# Patient Record
Sex: Female | Born: 1960 | Race: White | Hispanic: No
Health system: Southern US, Community
[De-identification: ages and names within clinical notes are randomized; demographics above are authoritative.]

---

## 2003-11-13 ENCOUNTER — Emergency Department: Payer: Self-pay | Admitting: Emergency Medicine

## 2004-04-04 ENCOUNTER — Emergency Department: Payer: Self-pay | Admitting: General Practice

## 2004-07-01 ENCOUNTER — Emergency Department: Payer: Self-pay | Admitting: General Practice

## 2004-11-18 ENCOUNTER — Emergency Department: Payer: Self-pay | Admitting: Unknown Physician Specialty

## 2005-02-27 ENCOUNTER — Emergency Department: Payer: Self-pay | Admitting: Emergency Medicine

## 2006-02-05 ENCOUNTER — Other Ambulatory Visit: Payer: Self-pay

## 2006-02-05 ENCOUNTER — Inpatient Hospital Stay: Payer: Self-pay | Admitting: Internal Medicine

## 2006-02-06 ENCOUNTER — Other Ambulatory Visit: Payer: Self-pay

## 2006-03-25 ENCOUNTER — Emergency Department: Payer: Self-pay | Admitting: Emergency Medicine

## 2006-08-18 ENCOUNTER — Other Ambulatory Visit: Payer: Self-pay

## 2006-08-19 ENCOUNTER — Inpatient Hospital Stay: Payer: Self-pay | Admitting: Internal Medicine

## 2006-09-06 ENCOUNTER — Other Ambulatory Visit: Payer: Self-pay

## 2006-09-06 ENCOUNTER — Inpatient Hospital Stay: Payer: Self-pay | Admitting: Internal Medicine

## 2007-02-10 IMAGING — CR RIGHT ANKLE - COMPLETE 3+ VIEW
1 series · 5 of 5 positions shown · non-contrast
Comparison: none

REASON FOR EXAM: Fall
COMMENTS:  LMP: Post Hysterectomy

PROCEDURE:     DXR - DXR ANKLE RIGHT COMPLETE  - November 18, 2004  [DATE]
RESULT:        Five views of the RIGHT ankle show no fracture, dislocation
or other acute bony abnormality.

[Series 2109: antero_posterior · 0.11mm/px · 5 of 5 slices shown]
[im 1/5]
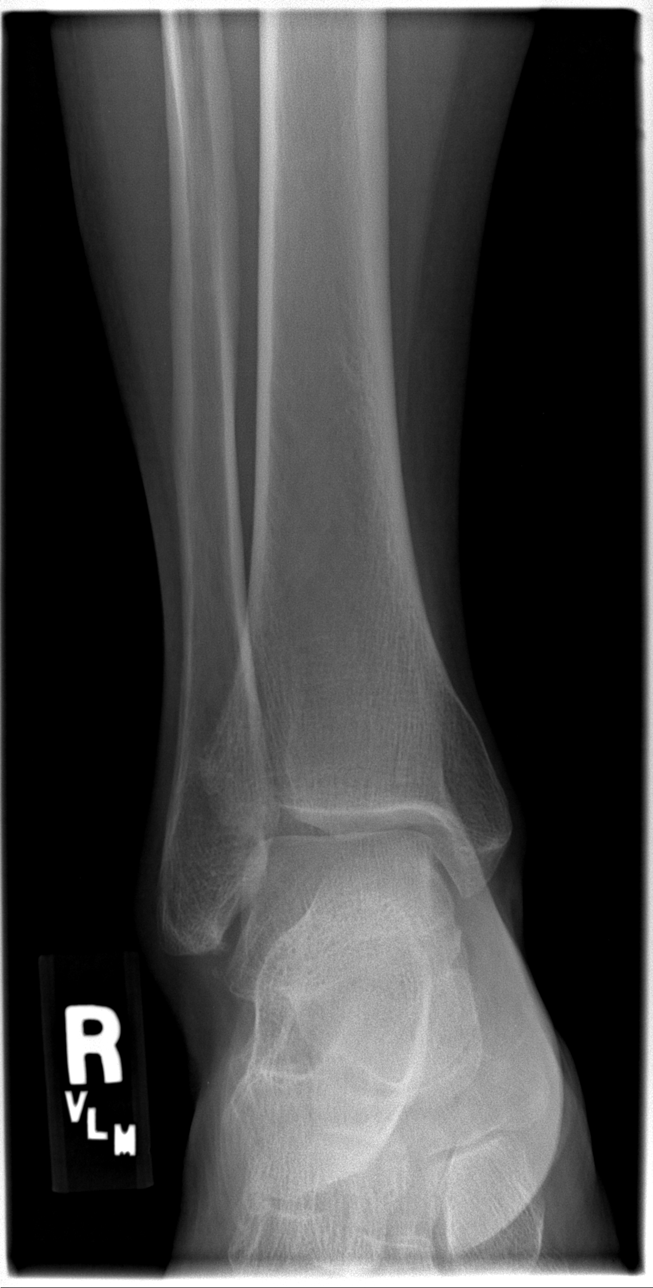
[im 2/5]
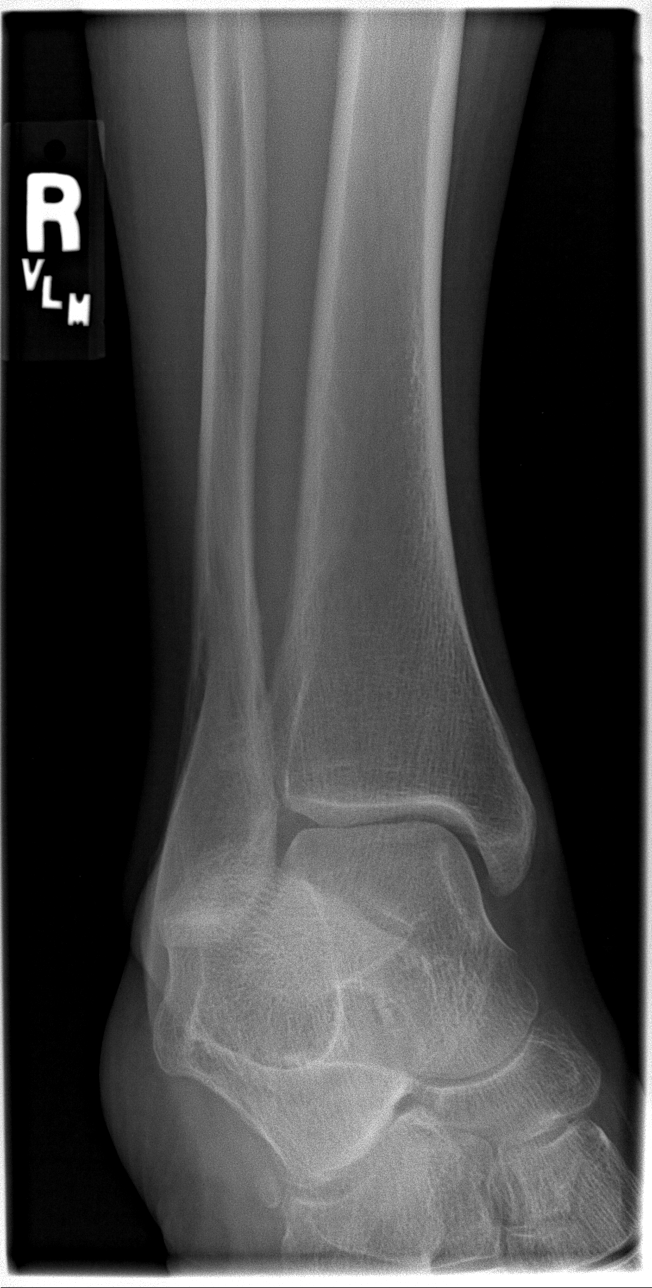
[im 3/5]
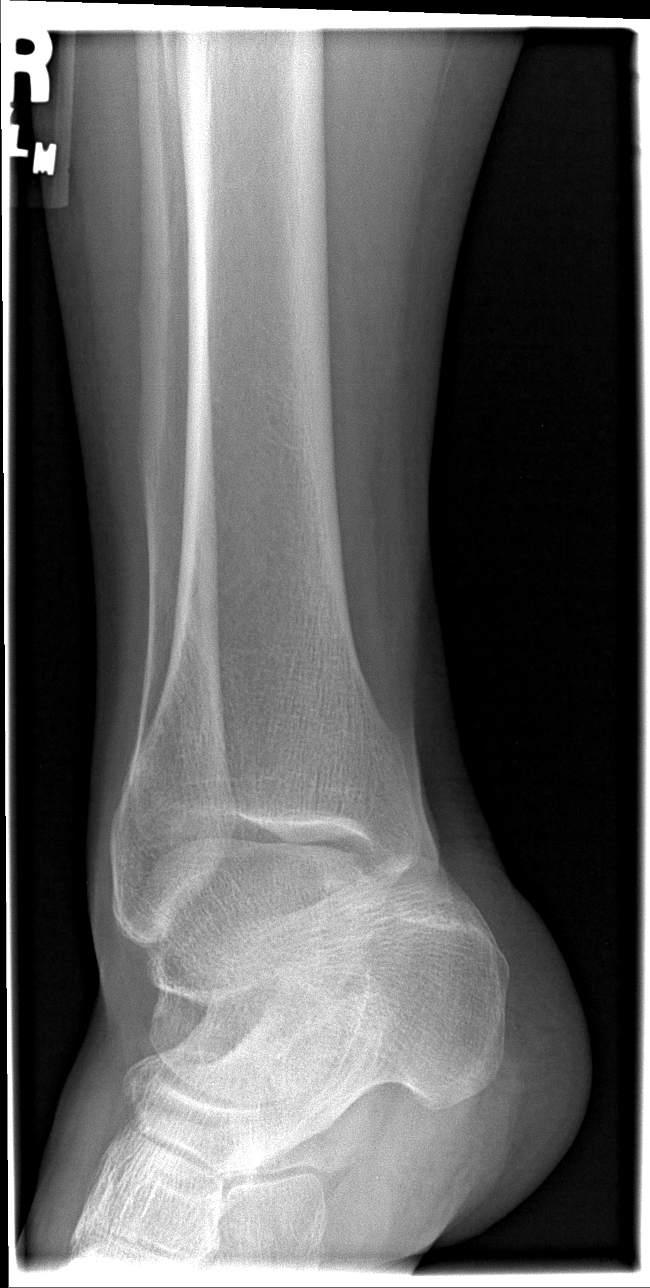
[im 4/5]
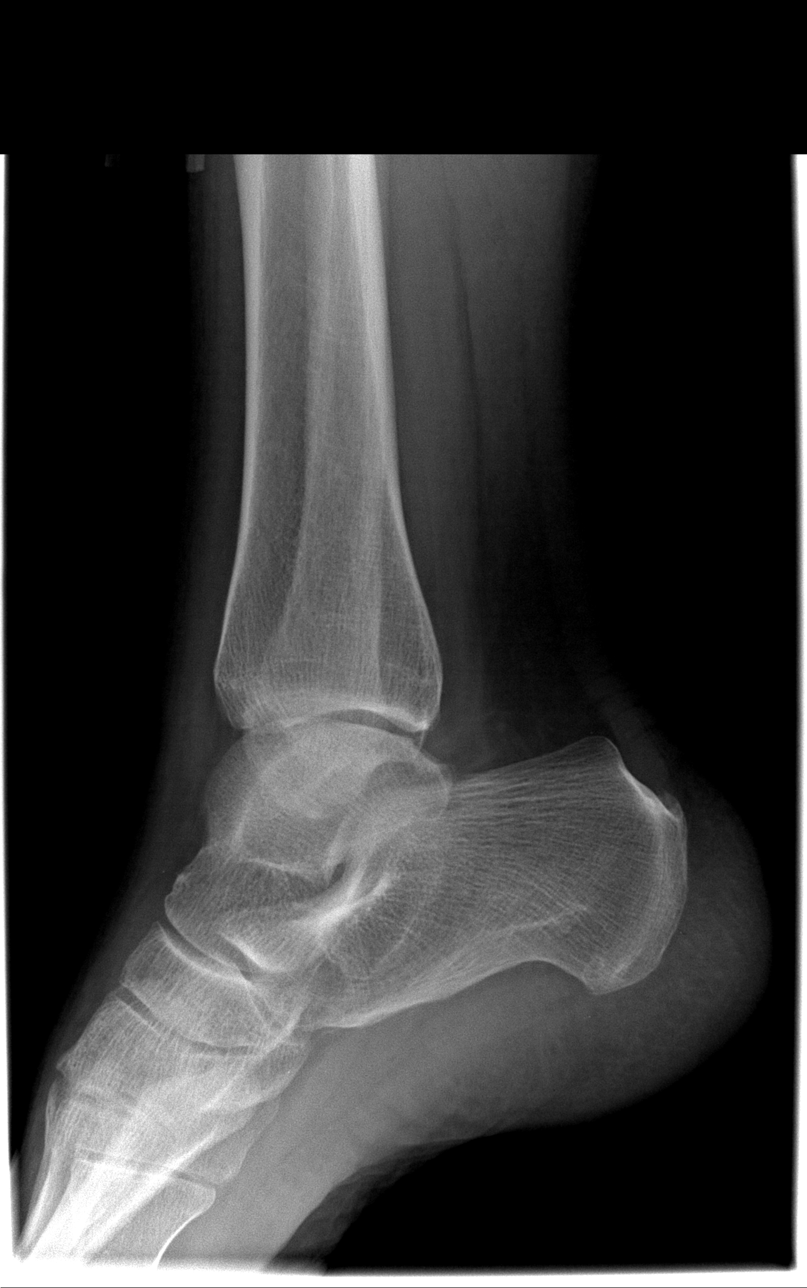
[im 5/5]
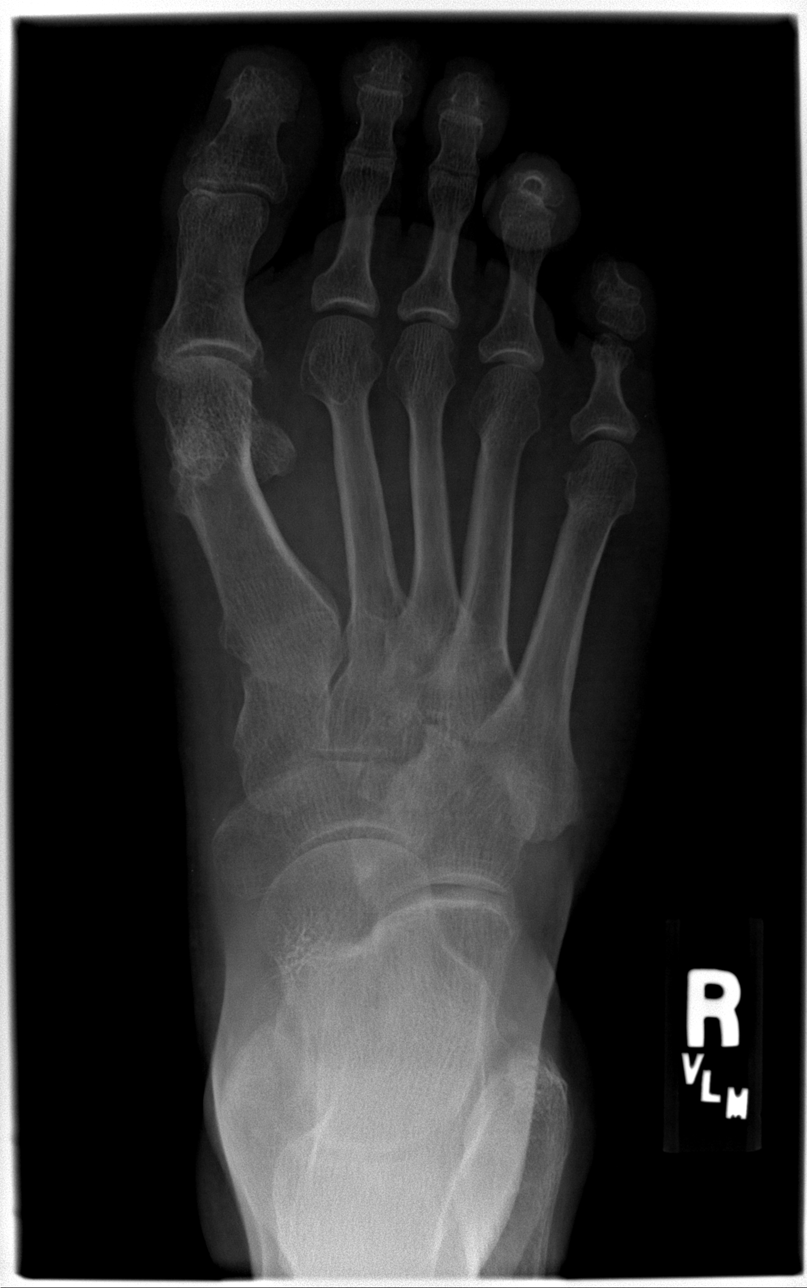

[5 of 5 positions shown; findings below may reference images not displayed]

IMPRESSION: No acute changes are identified.

## 2007-07-08 ENCOUNTER — Emergency Department: Payer: Self-pay | Admitting: Emergency Medicine

## 2008-09-10 ENCOUNTER — Emergency Department: Payer: Self-pay | Admitting: Emergency Medicine

## 2008-10-05 ENCOUNTER — Emergency Department: Payer: Self-pay | Admitting: Emergency Medicine

## 2008-10-10 ENCOUNTER — Emergency Department: Payer: Self-pay | Admitting: Emergency Medicine

## 2008-11-30 ENCOUNTER — Emergency Department: Payer: Self-pay | Admitting: Emergency Medicine

## 2008-12-05 ENCOUNTER — Emergency Department: Payer: Self-pay | Admitting: Emergency Medicine

## 2008-12-10 ENCOUNTER — Emergency Department: Payer: Self-pay | Admitting: Unknown Physician Specialty

## 2009-01-07 ENCOUNTER — Emergency Department: Payer: Self-pay | Admitting: Internal Medicine

## 2011-08-25 ENCOUNTER — Inpatient Hospital Stay: Payer: Self-pay | Admitting: Internal Medicine

## 2011-08-25 LAB — CBC
HGB: 12.5 g/dL (ref 12.0–16.0)
MCH: 32.8 pg (ref 26.0–34.0)
Platelet: 232 10*3/uL (ref 150–440)
RBC: 3.8 10*6/uL (ref 3.80–5.20)
WBC: 12.8 10*3/uL — ABNORMAL HIGH (ref 3.6–11.0)

## 2011-08-25 LAB — BASIC METABOLIC PANEL
Anion Gap: 4 — ABNORMAL LOW (ref 7–16)
BUN: 11 mg/dL (ref 7–18)
Calcium, Total: 8.7 mg/dL (ref 8.5–10.1)
Co2: 28 mmol/L (ref 21–32)
Creatinine: 0.97 mg/dL (ref 0.60–1.30)
Osmolality: 288 (ref 275–301)
Potassium: 4.7 mmol/L (ref 3.5–5.1)
Sodium: 146 mmol/L — ABNORMAL HIGH (ref 136–145)

## 2011-08-25 LAB — CK TOTAL AND CKMB (NOT AT ARMC)
CK, Total: 153 U/L (ref 21–215)
CK-MB: 1.8 ng/mL (ref 0.5–3.6)

## 2011-08-25 LAB — PRO B NATRIURETIC PEPTIDE: B-Type Natriuretic Peptide: 834 pg/mL — ABNORMAL HIGH (ref 0–125)

## 2011-08-25 LAB — TROPONIN I: Troponin-I: <0.02 ng/mL

## 2011-08-26 LAB — BASIC METABOLIC PANEL
BUN: 5 mg/dL — ABNORMAL LOW (ref 7–18)
Chloride: 114 mmol/L — ABNORMAL HIGH (ref 98–107)
Co2: 21 mmol/L (ref 21–32)
Creatinine: 0.93 mg/dL (ref 0.60–1.30)
EGFR (Non-African Amer.): >60
Osmolality: 288 (ref 275–301)

## 2011-08-26 LAB — TROPONIN I: Troponin-I: <0.02 ng/mL

## 2011-08-26 LAB — CK TOTAL AND CKMB (NOT AT ARMC)
CK, Total: 125 U/L (ref 21–215)
CK-MB: 1.3 ng/mL (ref 0.5–3.6)

## 2011-08-26 LAB — CBC WITH DIFFERENTIAL/PLATELET
Basophil #: 0 10*3/uL (ref 0.0–0.1)
Eosinophil #: 0 10*3/uL (ref 0.0–0.7)
Eosinophil %: 0 %
HCT: 35.8 % (ref 35.0–47.0)
HGB: 12.1 g/dL (ref 12.0–16.0)
MCV: 96 fL (ref 80–100)
Monocyte #: 0.2 x10 3/mm (ref 0.2–0.9)
Neutrophil %: 85.1 %
Platelet: 249 10*3/uL (ref 150–440)
RBC: 3.75 10*6/uL — ABNORMAL LOW (ref 3.80–5.20)
RDW: 15.8 % — ABNORMAL HIGH (ref 11.5–14.5)
WBC: 12 10*3/uL — ABNORMAL HIGH (ref 3.6–11.0)

## 2011-08-31 LAB — CULTURE, BLOOD (SINGLE)

## 2013-09-07 ENCOUNTER — Emergency Department: Payer: Self-pay | Admitting: Internal Medicine

## 2013-11-17 IMAGING — NM NM LUNG SCAN
2 series · 16 of 16 positions shown · non-contrast
Comparison: none

REASON FOR EXAM: CP/SOB
COMMENTS:

[Series 1000: lung ventilation · 3.90mm/px · 4 acquisitions, 8 frames shown]
[im 1/4]
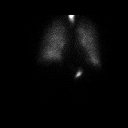
[im 1/4]
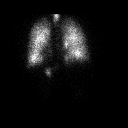
[im 2/4]
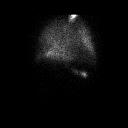
[im 2/4]
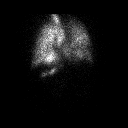
[im 3/4]
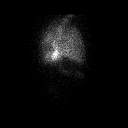
[im 3/4]
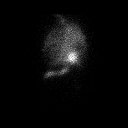
[im 4/4]
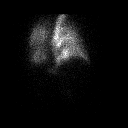
[im 4/4]
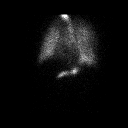

[Series 1000: lung perfusion · 1.95mm/px · 4 acquisitions, 8 frames shown]
[im 1/4]
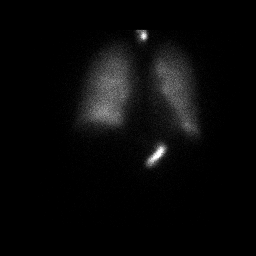
[im 1/4]
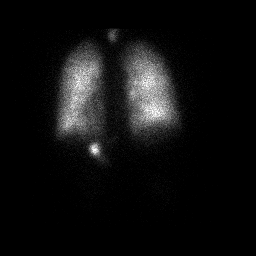
[im 2/4]
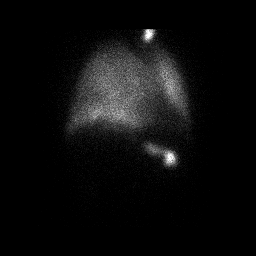
[im 2/4]
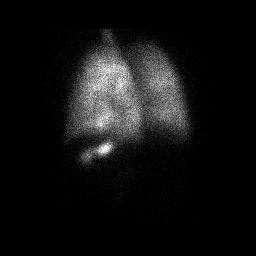
[im 3/4]
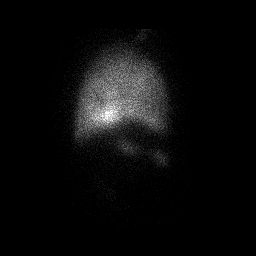
[im 3/4]
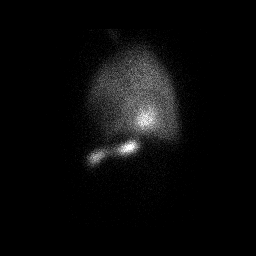
[im 4/4]
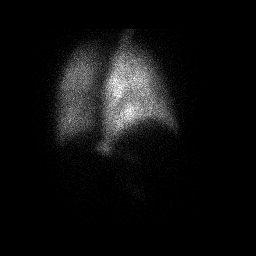
[im 4/4]
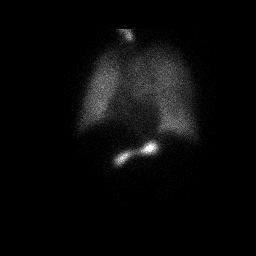

[16 of 16 positions shown; findings below may reference images not displayed]

PROCEDURE:     NM  - NM VQ LUNG SCAN  - [DATE] [DATE] [DATE] [DATE]

RESULT:

PROCEDURE:  Aerosolized DTPA was administered labeled with 43.56 mCi of
technetium 99m. The patient received 4.24 mCi technetium 99m labeled MAA via
a left forearm injection. Standard imaging of the chest was obtained for the
ventilation and perfusion portions of the study.

This study was compared to prior chest radiograph dated 08/25/2011.
FINDINGS: The chest radiograph demonstrates prominence of the interstitial
markings without focal regions of consolidation or focal infiltrates. The
cardiac silhouette is within normal limits. The visualized bony skeleton is
grossly unremarkable.

There are no areas of wedge-shaped pleural based ventilation/perfusion
mismatch defects to suggest an area of pulmonary arterial embolic disease.
IMPRESSION: Low probability ventilation/perfusion evaluation for
pulmonary arterial embolic disease as described above.

## 2014-05-03 NOTE — Discharge Summary (Signed)
PATIENT NAME:  Shannon Decker, [name] MR#:  [id number] DATE OF BIRTH:  [birth year]  DATE OF ADMISSION:  08/25/2011 DATE OF DISCHARGE:  08/26/2011   PRIMARY CARE PHYSICIAN: None local.   FINAL DIAGNOSES:  1. Chest pain and shortness of breath, possible pneumonia, chronic obstructive pulmonary disease exacerbation. 2. Respiratory failure, hypoxia resolved.  3. Chronic obstructive pulmonary disease.  4. Tobacco abuse.  5. Hypertension.  6. Hypothyroidism.    MEDICATIONS ON DISCHARGE:  1. Advair Diskus 500/50 one inhalation twice a day.  2. Wellbutrin SR 150 mg 1 tablet twice a day  3. Coreg 3.125 mg twice a day. 4. Clonazepam 1 mg 3 times a day.  5. Levothyroxine 50 mcg daily.  6. Plavix 75 mg daily.  7. Simvastatin 40 mg daily.  8. Topamax 3 tablets at bedtime. 9. Gabapentin 800 mg 3 to 4 tablets daily.  10. Tizanidine 4 mg 1 tablet in the morning, 3 tablets at bedtime. 11. Lisinopril 5 mg daily.  12. Prednisone taper as written on prescription. 13. Clarithromycin 500 mg twice a day for 10 days.  14. Albuterol 2 puffs every four hours as needed for shortness of breath.   DIET: Low sodium diet, regular consistency.   ACTIVITY: Activity as tolerated.   FOLLOW-UP: Follow-up with your medical doctor in 1 to 2 weeks.   REASON FOR ADMISSION: The patient was admitted 08/25/2011 and discharged 08/26/2011. She came in with chest pain, shortness of breath, and hypotension.   HISTORY OF PRESENT ILLNESS: This is a 54 year old female with COPD, tobacco abuse, coronary artery disease, and myocardial infarction. She was having chest pain over the past 3 or 4 days. In the Emergency Room she was noted to be hypoxic with initial oxygen saturation 89% on room air. She was given IV fluid boluses and hospitalist services were contacted for further evaluation. She was found to have an elevated D-dimer but they already did a CT scan of the abdomen with contrast so a V/Q scan was ordered for the morning.    LABORATORY AND RADIOLOGICAL DATA DURING THE HOSPITAL COURSE: EKG showed normal sinus rhythm, low voltage, nonspecific T wave abnormality.   D-dimer borderline at 0.57. BNP 834. Troponin negative. Glucose 70, BUN 11, creatinine 0.97, sodium 146, potassium 4.7, chloride 114, CO2 28, calcium 8.7, white blood cell count 12.8, hemoglobin and hematocrit 12.5 and 36.2, platelet count 232.   Another EKG showed normal sinus rhythm, Q waves septally, nonspecific T wave change.   Blood cultures negative.   CT scan of the abdomen and pelvis with contrast showed consolidation of portions of both lower lobes posteriorly. No evidence of bowel obstruction. No evidence of urinary tract abnormality or hepatobiliary abnormality. Gallbladder surgically absent.   Chest x-ray showed no focal regions of consolidation.   Next two troponins were negative. On the 12th room air pulse oximetry 94%.   Echocardiogram showed EF greater than 55%.   Lung V/Q scan showed low probability of ventilation perfusion abnormality. The patient was breathing much better on August 12th and was stable for discharge home.   HOSPITAL COURSE PER PROBLEM LIST:  1. For the patient's chest pain and shortness of breath, on a CT scan of the abdomen it caught the bottom of the lung which showed possibility of pneumonia. The patient also had COPD exacerbation, could also be anxiety. The patient was given IV Invanz initially by the admitting physician and IV Solu-Medrol. This was switched over to Biaxin and prednisone taper. The patient's lungs were clear upon  discharge.  2. For the patient's respiratory failure with hypoxia, this was resolved. Pulse oximetry on room air was 94%. No need for oxygen.  3. COPD exacerbation. The patient was given antibiotic and steroids and switched over to prednisone taper. She has her usual albuterol and Advair Diskus.  4. Tobacco abuse. Smoking cessation counseling done.  5. Hypertension. Blood pressure  currently stable.  6. Hypothyroidism. She is on her usual levothyroxine.  TIME SPENT ON DISCHARGE: 35 minutes.   ____________________________ Shannon Decker. Shannon Gloss, MD [id number] D: 08/26/2011 16:31:36 ET T: 08/26/2011 17:25:26 ET JOB#: [id number]  cc: Shannon Decker. Shannon Gloss, MD, <Dictator> Salley Scarlet MD ELECTRONICALLY SIGNED 08/28/2011 15:25

## 2014-05-03 NOTE — H&P (Signed)
PATIENT NAME:  Shannon Decker, Shannon Decker MR#:  [id number] DATE OF BIRTH:  11-03-1960  DATE OF ADMISSION:  08/25/2011  REFERRING PHYSICIAN: Dr. Carollee Massed  FAMILY PHYSICIAN: None local.   REASON FOR ADMISSION: Chest pain, shortness of breath, and hypotension.   HISTORY OF PRESENT ILLNESS: The patient is a 54 year old female with a history of multiple medical problems including chronic obstructive pulmonary disease/tobacco abuse, coronary artery disease status post myocardial infarction times two as well as a stent in her LAD. She was visiting her brother here in the hospital when she developed worsening chest pain and shortness of breath and was brought to the Emergency Room. The patient states that she has been having worsening shortness of breath with cough and pleuritic-type chest pain over the past 3 to 4 days. It got worse today. In the Emergency Room, the patient was noted to be hypoxic and hypotensive. Initial room air saturation was equal to 89%. In the Emergency Room the patient was given IV boluses of normal saline with improvement of her blood pressure. She remains short of breath and is now admitted for further evaluation.   PAST MEDICAL HISTORY:  1. Atherosclerotic cardiovascular disease status post myocardial infarction times 2.  2. Status post percutaneous transluminal coronary angioplasty with stent placement in a left anterior descending artery.  3. Bilateral hearing loss.  4. Anxiety/depression.  5. Chronic obstructive pulmonary disease/tobacco abuse.  6. Hypothyroidism.  7. Status post appendectomy.  8. Status post cholecystectomy.  9. Status post hysterectomy.  10. Status post bilateral foot surgery.   MEDICATIONS:  1. Plavix 75 mg p.o. daily.  2. Ultram 100 mg p.o. t.i.d.  3. Zocor 40 mg p.o. daily.  4. Synthroid 0.05 mg p.o. daily.  5. Zestril 5 mg p.o. daily.  6. Cymbalta 60 mg p.o. daily.  7. Klonopin 0.5 mg p.o. b.i.d.  8. Coreg 3.125 mg p.o. b.i.d.  9. Wellbutrin SR 150 mg  p.o. b.i.d.  10. Advair 250/50 1 puff b.i.d.  11. Albuterol 2 puffs q. 4 hours p.r.n. shortness of breath.   ALLERGIES: Sulfa, Augmentin, and aspirin.   SOCIAL HISTORY: The patient continues to smoke. Denies alcohol abuse.   FAMILY HISTORY: Positive for coronary artery disease, diabetes, hypertension, and stroke.   REVIEW OF SYSTEMS:  CONSTITUTIONAL: No fever or change in weight. EYES: No blurred or double vision. No glaucoma. ENT: No tinnitus or hearing loss. No nasal discharge or bleeding. No difficulty swallowing.  RESPIRATORY: The patient has had cough and wheezing but denies hemoptysis. Some painful respirations. CARDIOVASCULAR: Denies orthopnea or palpitations. No syncope. GI: No nausea, vomiting, or diarrhea. No abdominal pain. No change in bowel habits.  GU: No dysuria or hematuria. No incontinence. ENDOCRINE: No polyuria or polydipsia. No heat or cold intolerance. HEMATOLOGIC: The patient denies anemia, easy bruising, or bleeding. LYMPHATIC: No swollen glands. MUSCULOSKELETAL: The patient denies pain in her neck, back, shoulders, knees, or hips. No gout. NEUROLOGIC: No numbness, although she does feel weak. Denies migraines, stroke, or seizures. PSYCH: The patient denies anxiety, insomnia, or depression.   PHYSICAL EXAMINATION:  GENERAL: The patient is in no acute distress.   VITAL SIGNS: Vital signs remarkable for a blood pressure currently 97/53 with a heart rate of 73 and a respiratory rate of 22. She is afebrile.   HEENT: Normocephalic, atraumatic. Pupils equally round and reactive to light and accommodation. Extraocular movements are intact. Sclerae anicteric. Conjunctivae are clear. Oropharynx is clear.   NECK: Supple without jugular venous distention or bruits. No adenopathy  or thyromegaly is noted.   LUNGS: Scattered rhonchi and wheezes. Respiratory effort is increased. There is no dullness or rales noted.   CARDIAC: Regular rate and rhythm with normal S1 and S2. No  significant rubs, murmurs, or gallops. PMI is nondisplaced. Chest wall is nontender.   ABDOMEN: Soft, nontender, with normoactive bowel sounds. No organomegaly or masses were appreciated. No hernias or bruits were noted.   EXTREMITIES: Without clubbing, cyanosis, edema. Pulses were 2+ bilaterally.   SKIN: Warm and dry without rash or lesions.   NEUROLOGIC: Cranial nerves II through XII grossly intact. Deep tendon reflexes were symmetric. Motor and sensory exams nonfocal.   PSYCH: The patient was alert and oriented to person, place, and time. She was cooperative and used good judgment.   LABORATORY DATA: CT of the abdomen revealed small pleural effusions with possible pneumonia in the left lower lobe. Some mucous plugging was present. Her abdomen and pelvis were unremarkable. White count was 12.8 with a hemoglobin of 12.5. CK was 177 with an MB of 1.8 and a troponin of less than 0.02. Glucose was 70 with a BUN of 11 and a creatinine of 0.97 with a sodium of 146 and a potassium of 4.7.   ASSESSMENT:  1. Pleuritic chest pain with probable pneumonia.  2. Chronic obstructive pulmonary disease exacerbation.  3. Atherosclerotic cardiovascular disease.  4. Anxiety/depression.  5. Hypotension.  6. Hypothyroidism.   PLAN:  The patient will be admitted to the floor with off-unit telemetry and maintained on Plavix and Lovenox. We will continue IV fluids for blood pressure support. We will follow serial cardiac enzymes to rule out a myocardial infarction and obtain an echocardiogram.  In regards to her respiratory status, we will obtain a baseline chest x-ray now and a stat D-dimer. We will proceed with V/Q scan in the morning if her D-dimer is elevated. We will continue Lovenox as previously stated. We will begin empiric IV antibiotics with Xopenex and Atrovent SVNs as well as IV steroids and continuation of her Advair. We will supplement oxygen and wean as tolerated. We will follow her sugars while on  steroids. We will hold her lisinopril at this time because of her hypotension but continue her low-dose beta blocker given her underlying heart disease. Follow up routine labs in the morning. Further treatment and evaluation will depend upon the patient's progress.   TOTAL TIME SPENT:  50 minutes.   ____________________________ Duane LopeJeffrey D. Judithann SheenSparks, MD [id number] D: 08/25/2011 20:24:11 ET T: 08/26/2011 08:24:55 ET JOB#: [id number]  cc: Duane LopeJeffrey D. Judithann SheenSparks, MD, <Dictator> Jarmal Lewelling Rodena Medin Marieme Mcmackin MD ELECTRONICALLY SIGNED 08/26/2011 10:25

## 2014-10-27 ENCOUNTER — Encounter (HOSPITAL_COMMUNITY): Payer: Medicare Other

## 2014-11-22 ENCOUNTER — Encounter (HOSPITAL_COMMUNITY): Payer: Medicare Other | Attending: Cardiology
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
